# Patient Record
Sex: Male | Born: 1995
Health system: Southern US, Community
[De-identification: ages and names within clinical notes are randomized; demographics above are authoritative.]

## PROBLEM LIST (undated history)

## (undated) DIAGNOSIS — H9191 Unspecified hearing loss, right ear: Secondary | ICD-10-CM

## (undated) DIAGNOSIS — R519 Headache, unspecified: Secondary | ICD-10-CM

## (undated) HISTORY — DX: Headache, unspecified: R51.9

---
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
## 2015-09-06 MED FILL — PENICILLIN VK 500 MG TABLET: 500 | 8 days supply | Qty: 30 | Fill #0

## 2015-10-03 MED FILL — CIPRODEX OTIC SUSPENSION: 0.3-0.1 | 10 days supply | Qty: 8 | Fill #0

## 2015-10-03 MED FILL — AMOX-CLAV 875-125 MG TABLET: 875-125 | 10 days supply | Qty: 20 | Fill #0

## 2015-11-23 ENCOUNTER — Emergency Department (HOSPITAL_COMMUNITY): Payer: Self-pay

## 2015-11-23 ENCOUNTER — Emergency Department (HOSPITAL_COMMUNITY)
Admission: EM | Admit: 2015-11-23 | Discharge: 2015-11-24 | Disposition: A | Discharge status: Home / Self Care | Payer: Self-pay | Attending: Emergency Medicine | Admitting: Emergency Medicine

## 2015-11-23 ENCOUNTER — Encounter (HOSPITAL_COMMUNITY): Payer: Self-pay | Admitting: Emergency Medicine

## 2015-11-23 DIAGNOSIS — Z87891 Personal history of nicotine dependence: Secondary | ICD-10-CM | POA: Insufficient documentation

## 2015-11-23 DIAGNOSIS — R079 Chest pain, unspecified: Secondary | ICD-10-CM

## 2015-11-23 DIAGNOSIS — R1011 Right upper quadrant pain: Secondary | ICD-10-CM

## 2015-11-23 LAB — BASIC METABOLIC PANEL
ANION GAP: 7 (ref 5–15)
BUN: 12 mg/dL (ref 6–20)
CHLORIDE: 105 mmol/L (ref 101–111)
CO2: 28 mmol/L (ref 22–32)
Calcium: 9.9 mg/dL (ref 8.9–10.3)
Creatinine, Ser: 0.78 mg/dL (ref 0.61–1.24)
GFR calc Af Amer: >60 mL/min (ref >60)
GLUCOSE: 83 mg/dL (ref 65–99)
POTASSIUM: 4.2 mmol/L (ref 3.5–5.1)
Sodium: 140 mmol/L (ref 135–145)

## 2015-11-23 LAB — I-STAT TROPONIN, ED: Troponin i, poc: 0 ng/mL (ref 0.00–0.08)

## 2015-11-23 LAB — CBC
HEMATOCRIT: 42 % (ref 39.0–52.0)
HEMOGLOBIN: 15.2 g/dL (ref 13.0–17.0)
MCH: 31.7 pg (ref 26.0–34.0)
MCHC: 36.2 g/dL — ABNORMAL HIGH (ref 30.0–36.0)
MCV: 87.7 fL (ref 78.0–100.0)
Platelets: 320 10*3/uL (ref 150–400)
RBC: 4.79 MIL/uL (ref 4.22–5.81)
RDW: 12.5 % (ref 11.5–15.5)
WBC: 11 10*3/uL — AB (ref 4.0–10.5)

## 2015-11-23 NOTE — ED Triage Notes (Signed)
Pt c/o right sided chest pain onset Wednesday night. Pt describes it as a squeezing and tighness in his chest. Pt states tonight at work it became more centralized and then moved back to the right side.

## 2015-11-23 NOTE — ED Provider Notes (Signed)
[facility]-EMERGENCY DEPT Provider Note   CSN: [id number] Arrival date & time: 11/23/15  2159 By signing my name below, I, Christopher Bartlett, attest that this documentation has been prepared under the direction and in the presence of [employer], PA-C. Electronically Signed: Linus Bartlett, ED Scribe. 11/23/15. 11:39 PM.   History   Chief Complaint Chief Complaint  Patient presents with  . Chest Pain   The history is provided by the patient. No language interpreter was used.    HPI Comments: Christopher Bartlett is a 20 y.o. male who presents to the Emergency Department complaining of squeezing right-sided chest pain that began 2 days ago. Pt states each morning he has this pain that resolves spontaneously with no other episodes throughout the day. However, today, the pt states he was lying down and eating when he suddenly felt a squeezing right sided chest pain. Since then, his pain has been worsening intermittently. Pt also reports associated nausea and lightheadedness. Pt denies any fevers, chills, SOB, N/V/D or any other symptoms at this time. Pt denies family history of PE and DVT.    Past Medical History:  Diagnosis Date  . Deafness in right ear    There are no active problems to display for this patient.  History reviewed. No pertinent surgical history.  Home Medications    Prior to Admission medications   Medication Sig Start Date End Date Taking? Authorizing Provider  ibuprofen (ADVIL,MOTRIN) 200 MG tablet Take 400-600 mg by mouth every 6 (six) hours as needed.   Yes Historical Provider, MD   Family History No family history on file.  Social History Social History  Substance Use Topics  . Smoking status: Former Games developer  . Smokeless tobacco: Never Used  . Alcohol use No   Allergies   Patient has no known allergies.  Review of Systems Review of Systems A complete 10 system review of systems was obtained and all systems are negative except as noted in the HPI and PMH.     Physical Exam Updated Vital Signs BP 119/62 (BP Location: Right Arm)   Pulse 83   Temp 98 F (36.7 C) (Oral)   Resp 21   Ht 5\' 11"  (1.803 m)   Wt (!) 330 lb (149.7 kg)   SpO2 100%   BMI 46.03 kg/m   Physical Exam  Constitutional: He is oriented to person, place, and time. He appears well-developed and well-nourished. No distress.  Nontoxic and in NAD  HENT:  Head: Normocephalic and atraumatic.  Eyes: Conjunctivae and EOM are normal. No scleral icterus.  Neck: Normal range of motion.  Cardiovascular: Normal rate, regular rhythm and intact distal pulses.   Pulmonary/Chest: Effort normal. No respiratory distress. He has no wheezes. He exhibits tenderness.  Respirations even and unlabored. Lungs CTAB. There is TTP to the lower anterior right chest wall without crepitus or deformity.  Abdominal: Soft. He exhibits no distension and no mass. There is tenderness. There is no guarding.    Minimal RUQ TTP. Negative Murphy's sign. No peritoneal signs. Abdomen soft, obese.  Musculoskeletal: Normal range of motion.  Neurological: He is alert and oriented to person, place, and time. He exhibits normal muscle tone. Coordination normal.  GCS 15. Patient moving all extremities.  Skin: Skin is warm and dry. No rash noted. He is not diaphoretic. No erythema. No pallor.  Psychiatric: He has a normal mood and affect. His behavior is normal.  Nursing note and vitals reviewed.   ED Treatments / Results  DIAGNOSTIC STUDIES:  Oxygen Saturation is 100% on room air, normal by my interpretation.    COORDINATION OF CARE: 11:39 PM Discussed treatment plan with pt at bedside and pt agreed to plan.  Labs (all labs ordered are listed, but only abnormal results are displayed) Labs Reviewed  CBC - Abnormal; Notable for the following:       Result Value   WBC 11.0 (*)    MCHC 36.2 (*)    All other components within normal limits  HEPATIC FUNCTION PANEL - Abnormal; Notable for the following:     Total Protein 8.6 (*)    AST 44 (*)    Bilirubin, Direct <0.1 (*)    All other components within normal limits  BASIC METABOLIC PANEL  I-STAT TROPOININ, ED   EKG  EKG Interpretation None      Radiology Dg Chest 2 View  Result Date: 11/23/2015 CLINICAL DATA:  Initial evaluation for acute mid chest pain with shortness of breath for 3 days. EXAM: CHEST  2 VIEW COMPARISON:  None. FINDINGS: The cardiac and mediastinal silhouettes are within normal limits. The lungs are normally inflated. No airspace consolidation, pleural effusion, or pulmonary edema is identified. There is no pneumothorax. No acute osseous abnormality identified. IMPRESSION: No active cardiopulmonary disease. Electronically Signed   By: Rise MuBenjamin  McClintock M.D.   On: 11/23/2015 23:00   [place] Abdomen Limited Ruq  Result Date: 11/24/2015 CLINICAL DATA:  Right-sided chest pain, right upper quadrant pain and nausea for 2 days. EXAM: US ABDOMEN LIMITED - RIGHT UPPER QUADRANT COMPARISON:  None. FINDINGS: Gallbladder: No gallstones or wall thickening visualized. No sonographic Murphy sign noted by sonographer. Common bile duct: Diameter: 5.4 mm. Limited visualization of the bowel ducts due to poor penetration of the liver and body habitus. Proximal ducts demonstrates normal diameter. Distal ducts are not seen. Liver: Diffusely increased parenchymal echotexture with limited penetration suggesting diffuse fatty infiltration. Due to poor penetration, can't exclude focal lesions. IMPRESSION: No evidence of cholelithiasis or cholecystitis. No bile duct dilatation identified although distal ducts are not seen. Probable diffuse fatty infiltration of the liver. Electronically Signed   By: Burman NievesWilliam  Stevens M.D.   On: 11/24/2015 00:38   Procedures Procedures (including critical care time)  Medications Ordered in ED Medications - No data to display  Initial Impression / Assessment and Plan / ED Course  I have reviewed the triage vital signs  and the nursing notes.  Pertinent labs & imaging results that were available during my care of the patient were reviewed by me and considered in my medical decision making (see chart for details).  Clinical Course     Patient presenting for atypical, right sided, intermittent, waxing and waning, chest pain x 2 days, reproducible on palpation today. Laboratory work up is reassuring. Doubt cardiac etiology. CXR is negative for acute findings. Symptoms atypical for PE and patient is PERC negative. Gallbladder thought to be the cause, but US negative for stones. LFTs preserved. Given reproducibility of symptoms, suspect MSK etiology. Will manage supportively with medications, to be taken on an outpatient basis. Return precautions discussed and provided. Patient discharged in stable condition with no unaddressed concerns.   Final Clinical Impressions(s) / ED Diagnoses   Final diagnoses:  Right-sided chest pain    New Prescriptions Discharge Medication List as of 11/24/2015  2:14 AM      I personally performed the services described in this documentation, which was scribed in my presence. The recorded information has been reviewed and is accurate.    '  Antony MaduraKelly Azani Brogdon, PA-C 11/24/15 [id number]    Dione Boozeavid Glick, MD 11/24/15 [phone]

## 2015-11-24 LAB — HEPATIC FUNCTION PANEL
ALBUMIN: 4.7 g/dL (ref 3.5–5.0)
ALT: 63 U/L (ref 17–63)
AST: 44 U/L — ABNORMAL HIGH (ref 15–41)
Alkaline Phosphatase: 65 U/L (ref 38–126)
BILIRUBIN TOTAL: 0.3 mg/dL (ref 0.3–1.2)
Total Protein: 8.6 g/dL — ABNORMAL HIGH (ref 6.5–8.1)

## 2015-11-24 MED ORDER — IBUPROFEN 600 MG PO TABS: 600.0000 mg | ORAL_TABLET | Freq: Four times a day (QID) | ORAL | 0 refills | Status: DC | PRN

## 2015-11-24 NOTE — Discharge Instructions (Signed)
Drink plenty of water throughout the day to prevent dehydration. This may contribute to muscle spasms. Take ibuprofen as prescribed for pain, as needed. Follow-up with a primary care doctor regarding your visit today.

## 2016-01-14 HISTORY — PX: SKIN GRAFT: SHX250

## 2016-01-28 MED FILL — NAPROXEN 500 MG TABLET: 500 | 6 days supply | Qty: 12 | Fill #0

## 2017-05-18 IMAGING — US US ABDOMEN LIMITED
1 series · 14 of 25 positions shown · non-contrast
Comparison: None.

CLINICAL DATA: Right-sided chest pain, right upper quadrant pain
and nausea for 2 days.

EXAM:
US ABDOMEN LIMITED - RIGHT UPPER QUADRANT

[Series 1: us abdomen limited · 0.32mm/px · 14 of 117 slices shown]
[im 1/117]
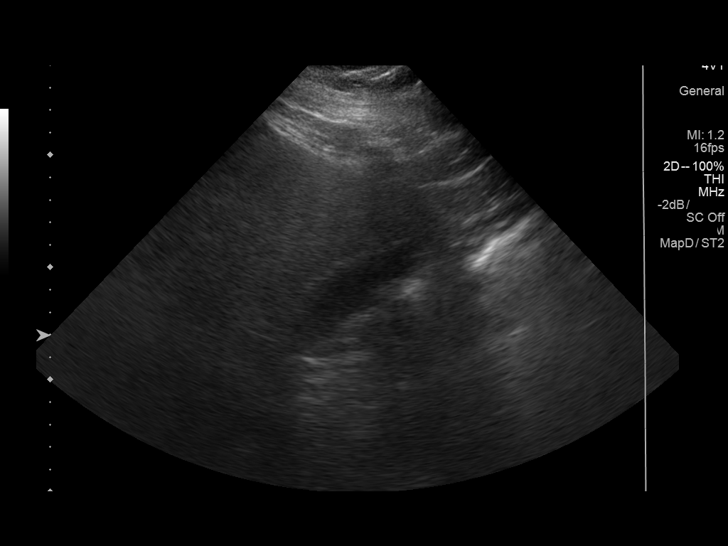
[im 10/117]
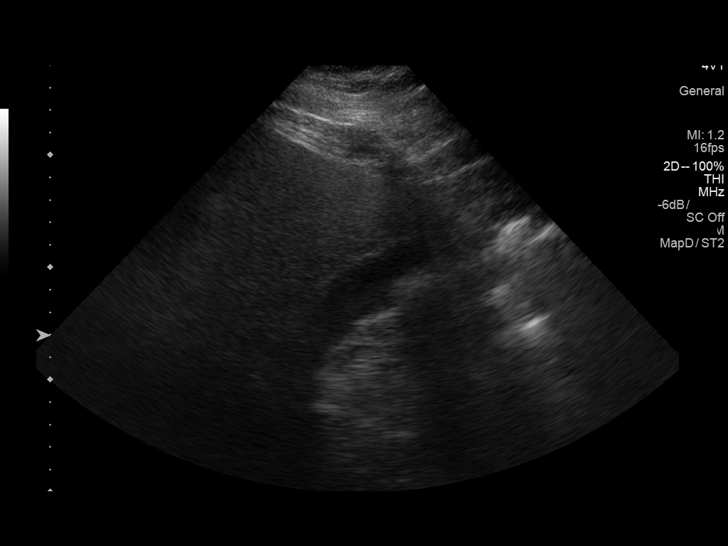
[im 20/117]
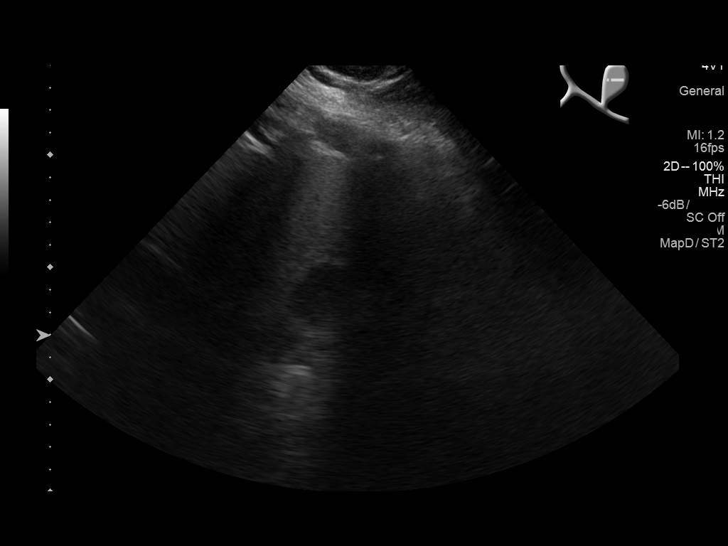
[im 30/117]
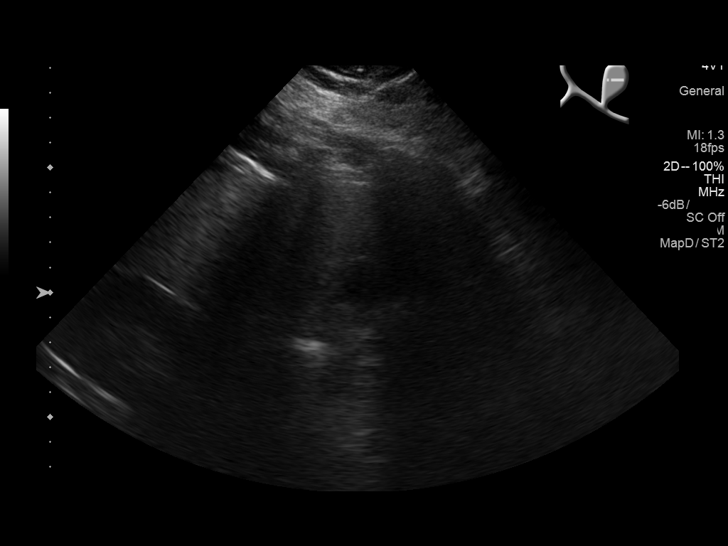
[im 39/117]
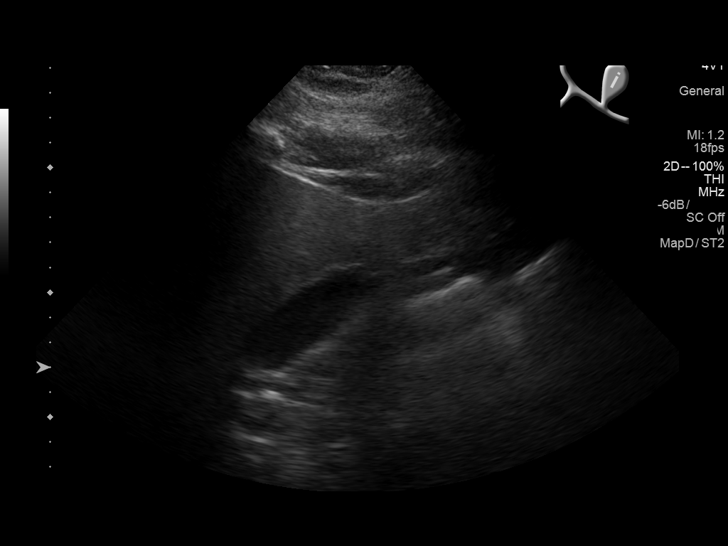
[im 44/117]
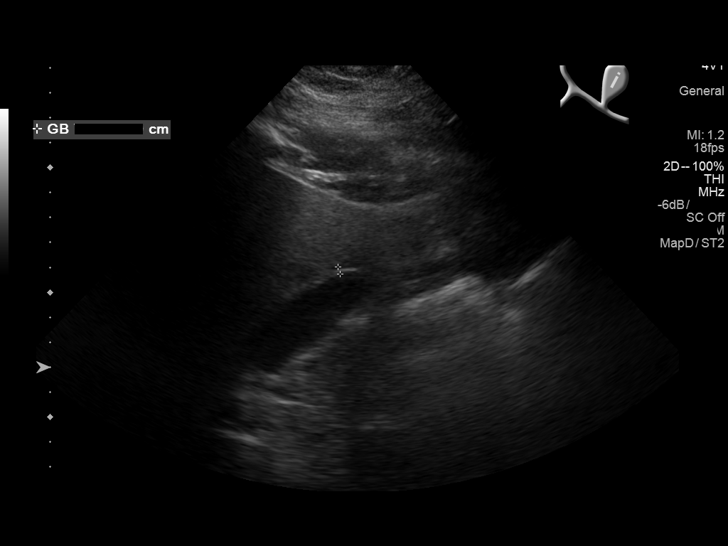
[im 54/117]
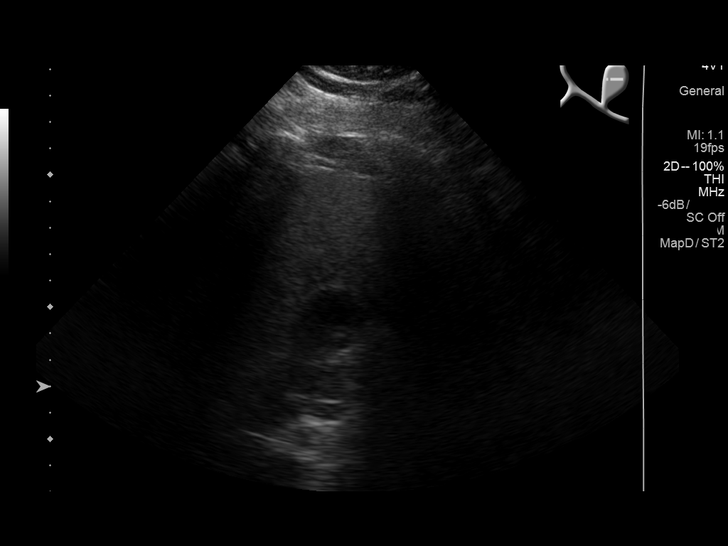
[im 63/117]
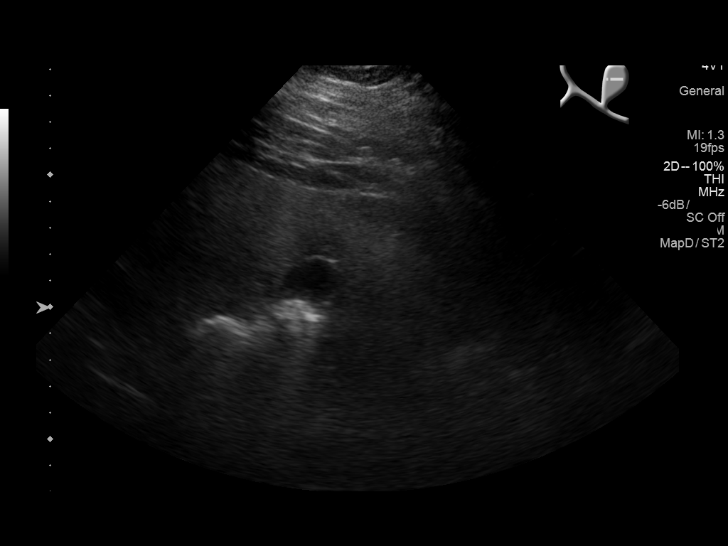
[im 73/117]
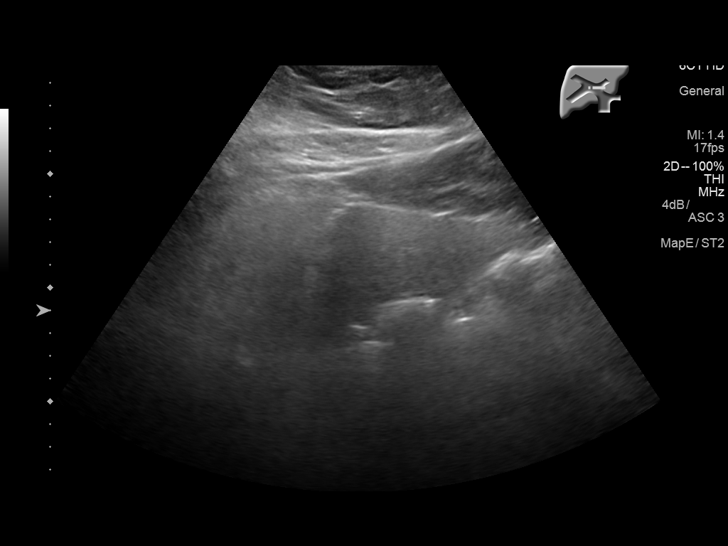
[im 78/117]
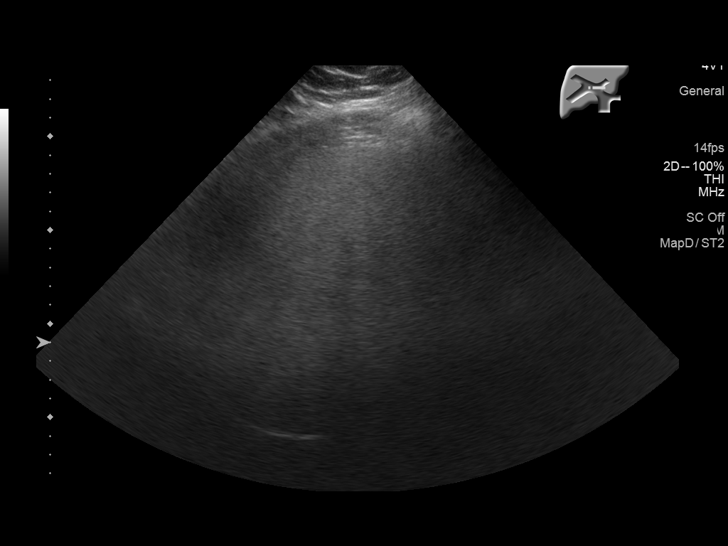
[im 88/117]
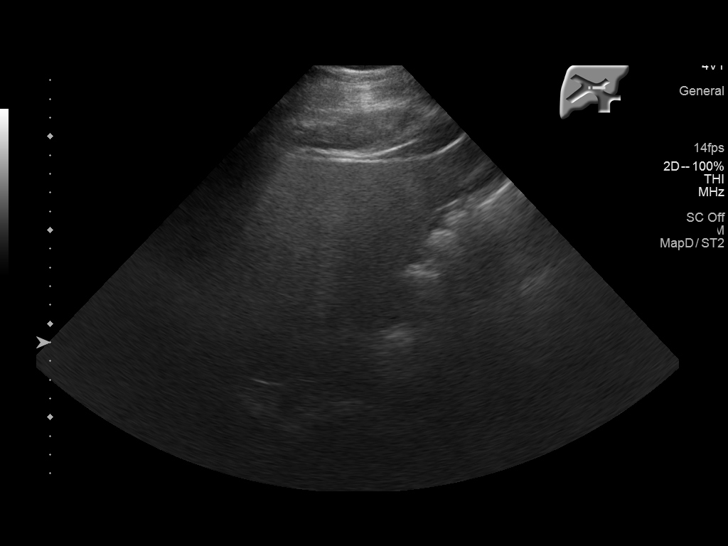
[im 97/117]
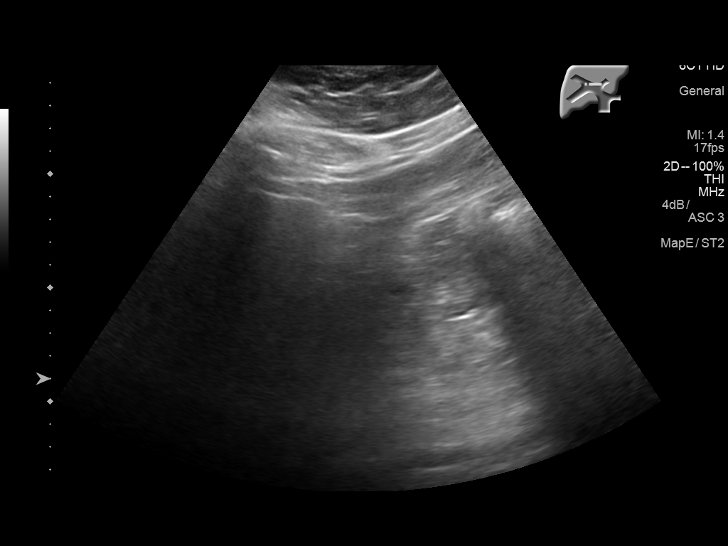
[im 107/117]
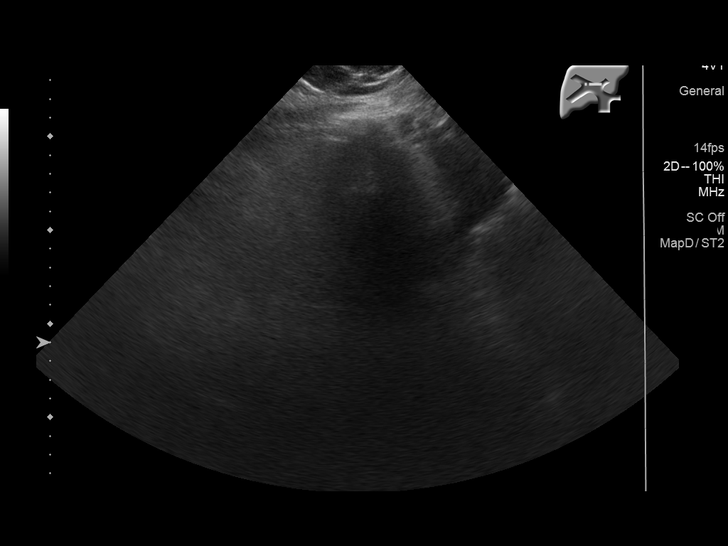
[im 117/117]
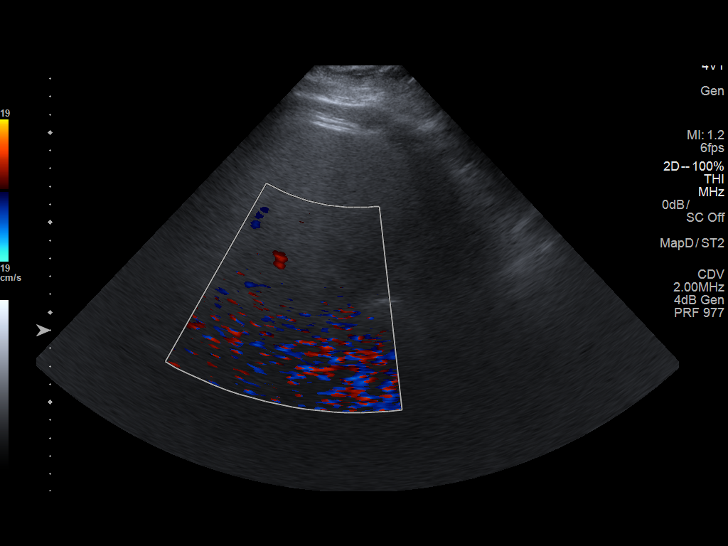

[14 of 25 positions shown; findings below may reference images not displayed]

FINDINGS: Gallbladder:

No gallstones or wall thickening visualized. No sonographic Murphy
sign noted by sonographer.

Common bile duct:

Diameter: 5.4 mm. Limited visualization of the bowel ducts due to
poor penetration of the liver and body habitus. Proximal ducts
demonstrates normal diameter. Distal ducts are not seen.

Liver:

Diffusely increased parenchymal echotexture with limited penetration
suggesting diffuse fatty infiltration. Due to poor penetration,
can't exclude focal lesions.
IMPRESSION: No evidence of cholelithiasis or cholecystitis. No bile duct
dilatation identified although distal ducts are not seen. Probable
diffuse fatty infiltration of the liver.

## 2018-08-17 ENCOUNTER — Encounter: Payer: Self-pay | Admitting: Podiatry

## 2018-08-17 ENCOUNTER — Other Ambulatory Visit: Payer: Self-pay

## 2018-08-17 ENCOUNTER — Ambulatory Visit: Payer: BC Managed Care – PPO | Admitting: Podiatry

## 2018-08-17 ENCOUNTER — Ambulatory Visit (INDEPENDENT_AMBULATORY_CARE_PROVIDER_SITE_OTHER): Payer: BC Managed Care – PPO

## 2018-08-17 VITALS — Temp 97.7°F

## 2018-08-17 DIAGNOSIS — M779 Enthesopathy, unspecified: Secondary | ICD-10-CM | POA: Diagnosis not present

## 2018-08-17 DIAGNOSIS — M79671 Pain in right foot: Secondary | ICD-10-CM

## 2018-08-17 DIAGNOSIS — M2140 Flat foot [pes planus] (acquired), unspecified foot: Secondary | ICD-10-CM | POA: Diagnosis not present

## 2018-08-17 MED ORDER — MELOXICAM 15 MG PO TABS: 15.0000 mg | ORAL_TABLET | Freq: Every day | ORAL | 0 refills | Status: DC

## 2018-08-17 MED ORDER — TRIAMCINOLONE ACETONIDE 0.025 % EX OINT: 1.0000 "application " | TOPICAL_OINTMENT | Freq: Two times a day (BID) | CUTANEOUS | 0 refills | Status: DC

## 2018-08-17 MED FILL — TRIAMCINOLONE 0.025% OINT: 0.025 | 10 days supply | Qty: 30 | Fill #0

## 2018-08-17 MED FILL — MELOXICAM 15 MG TABLET: 15 | 30 days supply | Qty: 30 | Fill #0

## 2018-08-26 NOTE — Progress Notes (Signed)
Subjective:   Patient ID: Christopher Bartlett, male   DOB: 23 y.o.   MRN: [id number]   HPI 23 year old male presents the office today for concerns of pain of the right foot on the top of the foot towards the center.  He states it hurts him when he walks.  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  He does wear gel inserts which is helped some.  Also ibuprofen is been helpful.  He wears comfortable shoes he has no pain.  No recent injury to his feet.  No other concerns today.  Review of Systems  All other systems reviewed and are negative.  Past Medical History:  Diagnosis Date  . Deafness in right ear     History reviewed. No pertinent surgical history.   Current Outpatient Medications:  .  meloxicam (MOBIC) 15 MG tablet, Take 1 tablet (15 mg total) by mouth daily., Disp: 30 tablet, Rfl: 0 .  triamcinolone (KENALOG) 0.025 % ointment, Apply 1 application topically 2 (two) times daily., Disp: 30 g, Rfl: 0  No Known Allergies      Objective:  Physical Exam  General: AAO x3, NAD  Dermatological: Small blister to the distal aspect the left hallux.  No surrounding erythema, ascending cellulitis.  No fluctuation crepitation.  This started on Saturday.  Vascular: Dorsalis Pedis artery and Posterior Tibial artery pedal pulses are 2/4 bilateral with immedate capillary fill time. There is no pain with calf compression, swelling, warmth, erythema.   Neruologic: Grossly intact via light touch bilateral. Vibratory intact via tuning fork bilateral. Protective threshold with Semmes Wienstein monofilament intact to all pedal sites bilateral.   Musculoskeletal: Splayfoot is present.  There is tenderness on the dorsal aspect of the midfoot but no specific area pinpoint tenderness.  No significant edema, erythema.  Muscular strength 5/5 in all groups tested bilateral.  Gait: Unassisted, Nonantalgic.      Assessment:   Right foot pain, capsulitis; left foot ulcer      Plan:  -Treatment options discussed including all alternatives, risks, and complications -Etiology of symptoms were discussed -X-rays were obtained and reviewed with the patient. Splayfoot present.  There is no evidence of acute fracture identified today. -We discussed shoe modifications and orthotics.  Note was written for him to wear sneakers at work as he does much better with this.  Also discussed orthotics.  Christopher Bartlett evaluated him today and he was measured for orthotics. -NSAIDs prn  Christopher Bartlett DPM

## 2018-09-07 ENCOUNTER — Other Ambulatory Visit: Payer: BC Managed Care – PPO | Admitting: Orthotics

## 2018-09-17 ENCOUNTER — Other Ambulatory Visit: Payer: BC Managed Care – PPO | Admitting: Orthotics

## 2019-06-01 ENCOUNTER — Encounter: Payer: Self-pay | Admitting: Family Medicine

## 2019-06-01 ENCOUNTER — Ambulatory Visit: Payer: BC Managed Care – PPO | Admitting: Family Medicine

## 2019-06-01 ENCOUNTER — Other Ambulatory Visit: Payer: Self-pay

## 2019-06-01 VITALS — BP 120/68 | HR 92 | Temp 98.4°F | Ht 70.0 in | Wt 331.4 lb

## 2019-06-01 DIAGNOSIS — Z6837 Body mass index (BMI) 37.0-37.9, adult: Secondary | ICD-10-CM | POA: Diagnosis not present

## 2019-06-01 DIAGNOSIS — K219 Gastro-esophageal reflux disease without esophagitis: Secondary | ICD-10-CM

## 2019-06-01 DIAGNOSIS — G44219 Episodic tension-type headache, not intractable: Secondary | ICD-10-CM

## 2019-06-01 DIAGNOSIS — E6609 Other obesity due to excess calories: Secondary | ICD-10-CM | POA: Diagnosis not present

## 2019-06-01 DIAGNOSIS — Z7689 Persons encountering health services in other specified circumstances: Secondary | ICD-10-CM

## 2019-06-01 NOTE — Progress Notes (Signed)
Subjective:    Patient ID: Christopher Bartlett, male    DOB: [birth year], 24 y.o.   MRN: [id number]  HPI Chief Complaint  Patient presents with  . New Patient (Initial Visit)    No PCP previously. Discuss migraines and difficulty sleeping.   This is a 24 yo male who presents today to establish care and with above cc. 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  Enjoys reading, fantasy.   Last CPE- 2019 Tdap- unknown, thinks he has had in last 3 years Flu- some years Dental- overdue Eye- last year Exercise- walks at work, doesn't enjoy gym Diet- just started keto style diet, has cut back on starches. Drinks energy drinks, green tea (diet). Eats one time a day when at work. Days off, eats 2-3 times a day. Little snacking. Enjoys cooking.   Headache over last two weeks, back of neck to area over right eye, pulsing, some light sensitivity, no sound sensitivity. Took an Alleve and two acetaminophen, good relief. Has been taking ibuprofen 2 tablets twice a day as needed. Headaches came on with dating a new girl, got better after he "dumped her." No visual changes, has difficulty focusing in am.   Sleep- works 12 hours, 6 am- 6 pm. Gets stressed if he isn't getting enough sleep. Commute is about 45 minutes. Has been told he snores. No family history of OSA. Some help with melatonin. Hard to wake up in the mornings. Always has been like that. Once he gets up and gets moving, feels rested. No caffeine late in day.   GERD- bad as a child, better when he has slowed down his eating. Some increase lately, ? Related to ibuprofen.   Review of Systems No chest pain, SOB, diarrhea, constipation, occasional knee pain, tries to wear supportive shoes    Objective:   Physical Exam Vitals reviewed.  Constitutional:      General: He is not in acute distress.    Appearance: Normal appearance. He is obese. He is not ill-appearing, toxic-appearing or diaphoretic.  HENT:     Head:  Normocephalic and atraumatic.  Eyes:     Conjunctiva/sclera: Conjunctivae normal.  Cardiovascular:     Rate and Rhythm: Normal rate and regular rhythm.     Heart sounds: Normal heart sounds.  Pulmonary:     Effort: Pulmonary effort is normal.     Breath sounds: Normal breath sounds.  Musculoskeletal:     Right lower leg: No edema.     Left lower leg: No edema.  Skin:    General: Skin is warm and dry.  Neurological:     Mental Status: He is alert and oriented to person, place, and time.  Psychiatric:        Mood and Affect: Mood normal.        Behavior: Behavior normal.        Thought Content: Thought content normal.        Judgment: Judgment normal.          BP 120/68 (BP Location: Left Arm, Patient Position: Sitting, Cuff Size: Large)   Pulse 92   Temp 98.4 F (36.9 C) (Temporal)   Ht 5\' 10"  (1.778 m)   Wt (!) 331 lb 6.4 oz (150.3 kg)   SpO2 97%   BMI 47.55 kg/m  Wt Readings from Last 3 Encounters:  06/01/19 (!) 331 lb 6.4 oz (150.3 kg)  11/23/15 (!) 330 lb (149.7 kg)  05/01/11 280 lb (127 kg) (>99 %,  Z= 3.03)*   * Growth percentiles are based on CDC (Boys, 2-20 Years) data.   Depression screen PHQ 2/9 06/01/2019  Decreased Interest 0  Down, Depressed, Hopeless 0  PHQ - 2 Score 0    Assessment & Plan:  1. Encounter to establish care - Discussed and encouraged healthy lifestyle choices- adequate sleep, regular exercise, stress management and healthy food choices.  - Reviewed available records in EMR  2. Class 2 obesity due to excess calories without serious comorbidity with body mass index (BMI) of 37.0 to 37.9 in adult - discussed diet, barriers to good choices, provided written and verbal information regarding balanced meals and resources  - Lipid Panel - CBC with Differential - Comprehensive metabolic panel - TSH - Vitamin D, 25-hydroxy - Hemoglobin A1c  3. Episodic tension-type headache, not intractable - these have improved, discussed importance of  sleep, hydration, avoiding overuse of otc analgesics. I have asked him to keep a headache log and follow up if headaches return.  4. Gastroesophageal reflux disease, unspecified whether esophagitis present - currently well controlled with occasional otc antacids. Discussed weight loss, avoiding triggers  This visit occurred during the SARS-CoV-2 public health emergency.  Safety protocols were in place, including screening questions prior to the visit, additional usage of staff PPE, and extensive cleaning of exam room while observing appropriate contact time as indicated for disinfecting solutions.      Olean Ree, FNP-BC  [facility], [facility]  06/02/2019 6:46 AM

## 2019-06-01 NOTE — Patient Instructions (Addendum)
   A resource that I like is www.dietdoctor.com  Youtube- Dr. Wylene Simmer, Dr. Danny Lawless, Dr. Allyson Sabal  Books- The obesity code  Here are some guidelines to help you with meal planning -  Avoid all processed and packaged foods (bread, pasta, crackers, chips, etc) and beverages containing calories.  Avoid added sugars and excessive natural sugars.  Attention to how you feel if you consume artificial sweeteners.  Do they make you more hungry or raise your blood sugar?  With every meal and snack, aim to get 20 g of protein (3 ounces of meat, 4 ounces of fish, 3 eggs, protein powder, 1 cup [place] yogurt, 1 cup cottage cheese, etc.)  Increase fiber in the form of non-starchy vegetables.  These help you feel full with very little carbohydrates and are good for gut health.  Eat 1 serving healthy carb per meal- 1/2 cup brown rice, beans, potato, corn- pay attention to whether or not this significantly raises your blood sugar. If it does, reduce the frequency you consume these.   Eat 2-3 servings of lower sugar fruits daily.  This includes berries, apples, oranges, peaches, pears, one half banana.  Have small amounts of good fats such as avocado, nuts, olive oil, nut butters, olives.  Add a little cheese to your salads to make them tasty.

## 2019-06-02 ENCOUNTER — Encounter: Payer: Self-pay | Admitting: Family Medicine

## 2019-06-02 DIAGNOSIS — E66812 Obesity, class 2: Secondary | ICD-10-CM | POA: Insufficient documentation

## 2019-06-02 DIAGNOSIS — E6609 Other obesity due to excess calories: Secondary | ICD-10-CM | POA: Insufficient documentation

## 2019-06-02 DIAGNOSIS — G44219 Episodic tension-type headache, not intractable: Secondary | ICD-10-CM | POA: Insufficient documentation

## 2019-06-02 DIAGNOSIS — K219 Gastro-esophageal reflux disease without esophagitis: Secondary | ICD-10-CM | POA: Insufficient documentation

## 2019-06-02 LAB — COMPREHENSIVE METABOLIC PANEL
ALT: 72 U/L — ABNORMAL HIGH (ref 0–53)
AST: 45 U/L — ABNORMAL HIGH (ref 0–37)
Albumin: 4.8 g/dL (ref 3.5–5.2)
Alkaline Phosphatase: 57 U/L (ref 39–117)
BUN: 17 mg/dL (ref 6–23)
CO2: 27 mEq/L (ref 19–32)
Calcium: 10.3 mg/dL (ref 8.4–10.5)
Chloride: 100 mEq/L (ref 96–112)
Creatinine, Ser: 0.83 mg/dL (ref 0.40–1.50)
GFR: 112.96 mL/min (ref >60.00)
Glucose, Bld: 73 mg/dL (ref 70–99)
Potassium: 4.2 mEq/L (ref 3.5–5.1)
Sodium: 135 mEq/L (ref 135–145)
Total Bilirubin: 0.4 mg/dL (ref 0.2–1.2)
Total Protein: 8 g/dL (ref 6.0–8.3)

## 2019-06-02 LAB — CBC WITH DIFFERENTIAL/PLATELET
Basophils Absolute: 0.1 10*3/uL (ref 0.0–0.1)
Basophils Relative: 1 % (ref 0.0–3.0)
Eosinophils Absolute: 0.4 10*3/uL (ref 0.0–0.7)
Eosinophils Relative: 2.7 % (ref 0.0–5.0)
HCT: 46.8 % (ref 39.0–52.0)
Hemoglobin: 15.8 g/dL (ref 13.0–17.0)
Lymphocytes Relative: 25.7 % (ref 12.0–46.0)
Lymphs Abs: 3.3 10*3/uL (ref 0.7–4.0)
MCHC: 33.8 g/dL (ref 30.0–36.0)
MCV: 93 fl (ref 78.0–100.0)
Monocytes Absolute: 1.2 10*3/uL — ABNORMAL HIGH (ref 0.1–1.0)
Monocytes Relative: 9.1 % (ref 3.0–12.0)
Neutro Abs: 7.9 10*3/uL — ABNORMAL HIGH (ref 1.4–7.7)
Neutrophils Relative %: 61.5 % (ref 43.0–77.0)
Platelets: 373 10*3/uL (ref 150.0–400.0)
RBC: 5.03 Mil/uL (ref 4.22–5.81)
RDW: 12.9 % (ref 11.5–15.5)
WBC: 12.9 10*3/uL — ABNORMAL HIGH (ref 4.0–10.5)

## 2019-06-02 LAB — LIPID PANEL
Cholesterol: 187 mg/dL (ref 0–200)
HDL: 33.4 mg/dL — ABNORMAL LOW (ref >39.00)
NonHDL: 153.57
Total CHOL/HDL Ratio: 6
Triglycerides: 243 mg/dL — ABNORMAL HIGH (ref 0.0–149.0)
VLDL: 48.6 mg/dL — ABNORMAL HIGH (ref 0.0–40.0)

## 2019-06-02 LAB — LDL CHOLESTEROL, DIRECT: Direct LDL: 115 mg/dL

## 2019-06-02 LAB — TSH: TSH: 1.54 u[IU]/mL (ref 0.35–4.50)

## 2019-06-02 LAB — VITAMIN D 25 HYDROXY (VIT D DEFICIENCY, FRACTURES): VITD: 22.73 ng/mL — ABNORMAL LOW (ref 30.00–100.00)

## 2019-06-02 LAB — HEMOGLOBIN A1C: Hgb A1c MFr Bld: 5.6 % (ref 4.6–6.5)
